# Patient Record
Sex: Female | Born: 1985 | Race: Black or African American | Hispanic: No | Marital: Single | Smoking: Never smoker
Health system: Southern US, Community
[De-identification: ages and names within clinical notes are randomized; demographics above are authoritative.]

## PROBLEM LIST (undated history)

## (undated) HISTORY — PX: FRACTURE SURGERY: SHX138

---
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
## 2006-04-14 ENCOUNTER — Emergency Department: Payer: Self-pay | Admitting: Unknown Physician Specialty

## 2006-07-08 ENCOUNTER — Emergency Department: Payer: Self-pay | Admitting: General Practice

## 2006-08-19 ENCOUNTER — Emergency Department: Payer: Self-pay | Admitting: Unknown Physician Specialty

## 2006-10-29 ENCOUNTER — Emergency Department: Payer: Self-pay | Admitting: General Practice

## 2006-11-02 ENCOUNTER — Emergency Department: Payer: Self-pay | Admitting: Emergency Medicine

## 2006-11-25 ENCOUNTER — Emergency Department: Payer: Self-pay | Admitting: Emergency Medicine

## 2007-04-29 ENCOUNTER — Emergency Department: Payer: Self-pay | Admitting: Emergency Medicine

## 2007-05-24 ENCOUNTER — Emergency Department: Payer: Self-pay | Admitting: Emergency Medicine

## 2007-07-23 ENCOUNTER — Emergency Department: Payer: Self-pay | Admitting: Emergency Medicine

## 2007-09-01 ENCOUNTER — Emergency Department: Payer: Self-pay | Admitting: Internal Medicine

## 2007-09-12 ENCOUNTER — Emergency Department: Payer: Self-pay | Admitting: Emergency Medicine

## 2007-10-26 ENCOUNTER — Emergency Department: Payer: Self-pay | Admitting: Emergency Medicine

## 2007-11-09 ENCOUNTER — Emergency Department: Payer: Self-pay | Admitting: Emergency Medicine

## 2008-01-05 ENCOUNTER — Emergency Department: Payer: Self-pay | Admitting: Emergency Medicine

## 2008-02-23 ENCOUNTER — Emergency Department: Payer: Self-pay | Admitting: Emergency Medicine

## 2008-03-24 ENCOUNTER — Emergency Department: Payer: Self-pay | Admitting: Internal Medicine

## 2008-04-07 ENCOUNTER — Emergency Department: Payer: Self-pay | Admitting: Internal Medicine

## 2008-07-24 ENCOUNTER — Emergency Department: Payer: Self-pay | Admitting: Emergency Medicine

## 2008-07-26 ENCOUNTER — Emergency Department: Payer: Self-pay | Admitting: Emergency Medicine

## 2008-08-19 ENCOUNTER — Emergency Department: Payer: Self-pay | Admitting: Emergency Medicine

## 2008-08-28 ENCOUNTER — Emergency Department: Payer: Self-pay | Admitting: Emergency Medicine

## 2008-09-11 ENCOUNTER — Emergency Department: Payer: Self-pay | Admitting: Emergency Medicine

## 2008-11-14 ENCOUNTER — Emergency Department: Payer: Self-pay | Admitting: Emergency Medicine

## 2008-11-29 ENCOUNTER — Emergency Department (HOSPITAL_COMMUNITY): Admission: EM | Admit: 2008-11-29 | Discharge: 2008-11-29 | Payer: Self-pay | Admitting: Emergency Medicine

## 2009-01-19 ENCOUNTER — Emergency Department: Payer: Self-pay | Admitting: Emergency Medicine

## 2009-02-01 ENCOUNTER — Emergency Department: Payer: Self-pay | Admitting: Emergency Medicine

## 2009-04-16 ENCOUNTER — Emergency Department: Payer: Self-pay | Admitting: Emergency Medicine

## 2009-06-02 ENCOUNTER — Emergency Department: Payer: Self-pay | Admitting: Emergency Medicine

## 2009-07-18 ENCOUNTER — Emergency Department: Payer: Self-pay | Admitting: Emergency Medicine

## 2009-08-04 ENCOUNTER — Emergency Department: Payer: Self-pay | Admitting: Emergency Medicine

## 2009-09-06 ENCOUNTER — Emergency Department: Payer: Self-pay | Admitting: Unknown Physician Specialty

## 2009-12-04 ENCOUNTER — Emergency Department: Payer: Self-pay | Admitting: Emergency Medicine

## 2010-01-15 ENCOUNTER — Emergency Department: Payer: Self-pay | Admitting: Emergency Medicine

## 2010-01-17 ENCOUNTER — Emergency Department: Payer: Self-pay | Admitting: Internal Medicine

## 2010-02-23 ENCOUNTER — Emergency Department: Payer: Self-pay | Admitting: Emergency Medicine

## 2010-02-28 ENCOUNTER — Emergency Department: Payer: Self-pay | Admitting: Emergency Medicine

## 2010-04-15 ENCOUNTER — Emergency Department: Payer: Self-pay | Admitting: Emergency Medicine

## 2010-05-02 IMAGING — CR RIGHT HAND - COMPLETE 3+ VIEW
1 series · 3 of 3 positions shown · non-contrast
Comparison: none

REASON FOR EXAM: fall/pain/swelling..pt in WR
COMMENTS:   LMP: One week ago

PROCEDURE:     DXR - DXR HAND RT COMPLETE W/OBLIQUES  - August 04, 2009  [DATE]
RESULT:     Comparison:  None

[Series 1: view not recorded · 0.17mm/px · 3 of 3 slices shown]
[im 1/3]
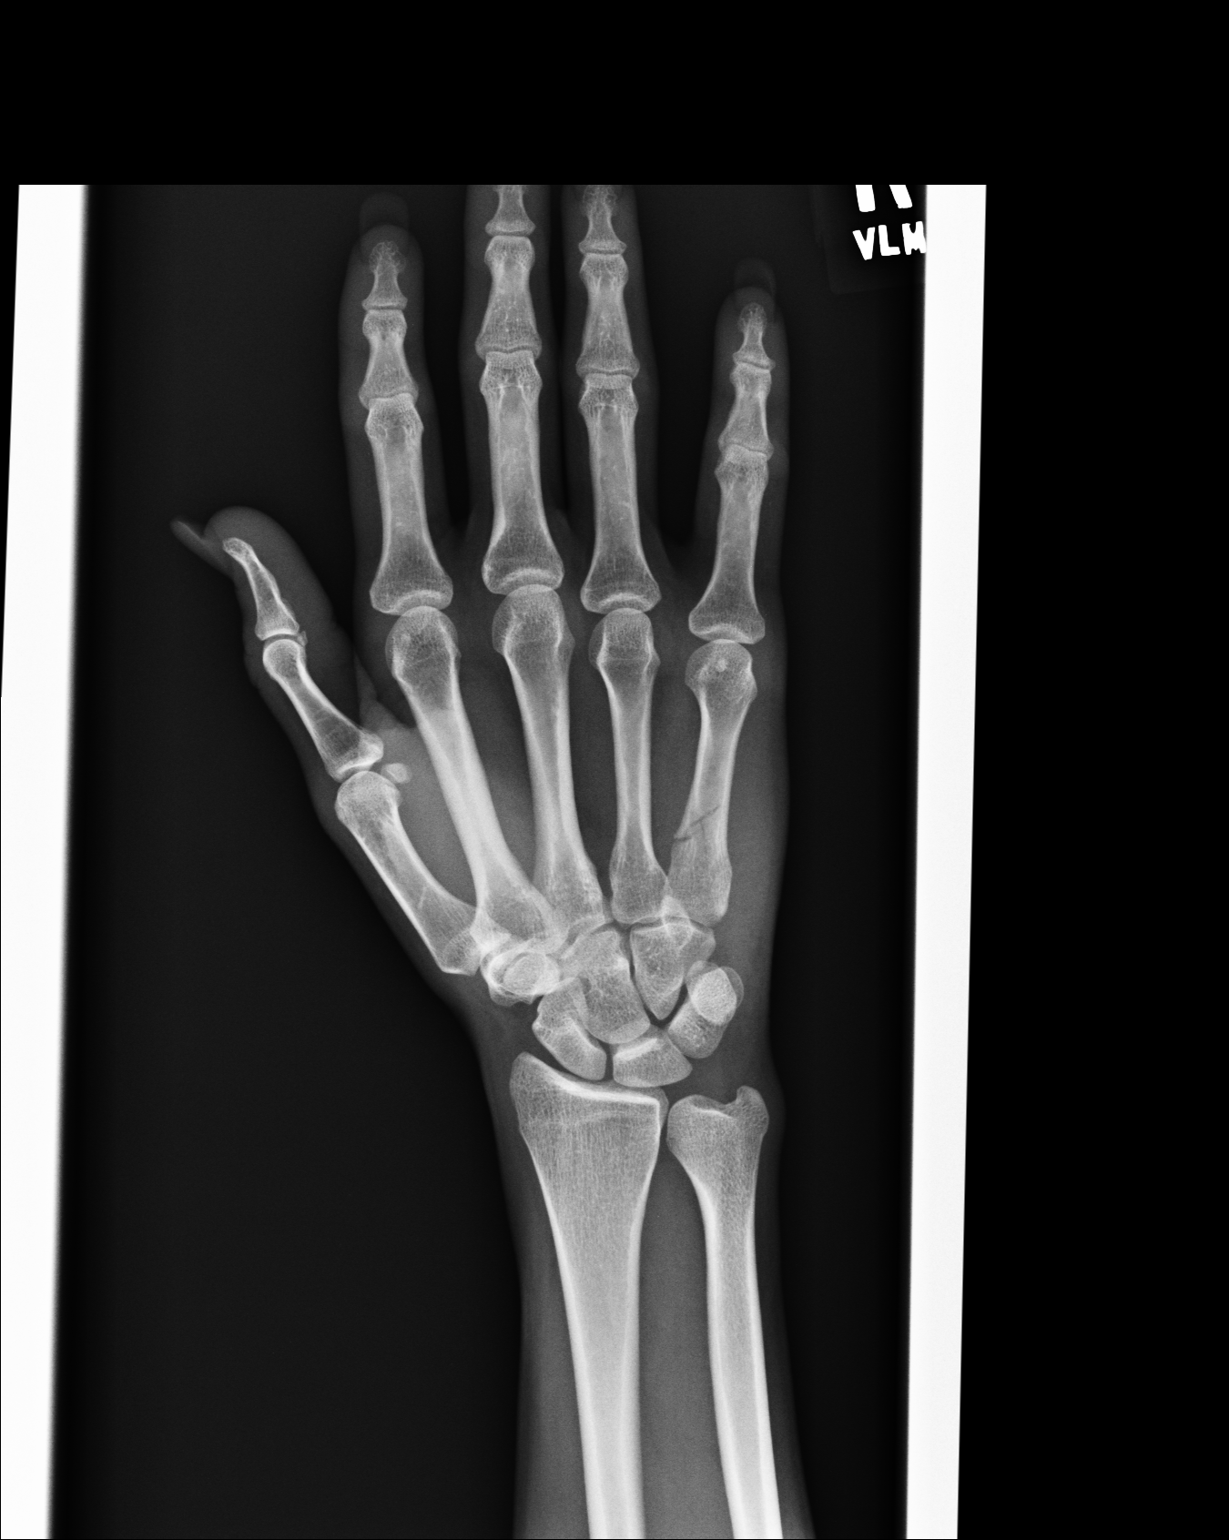
[im 2/3]
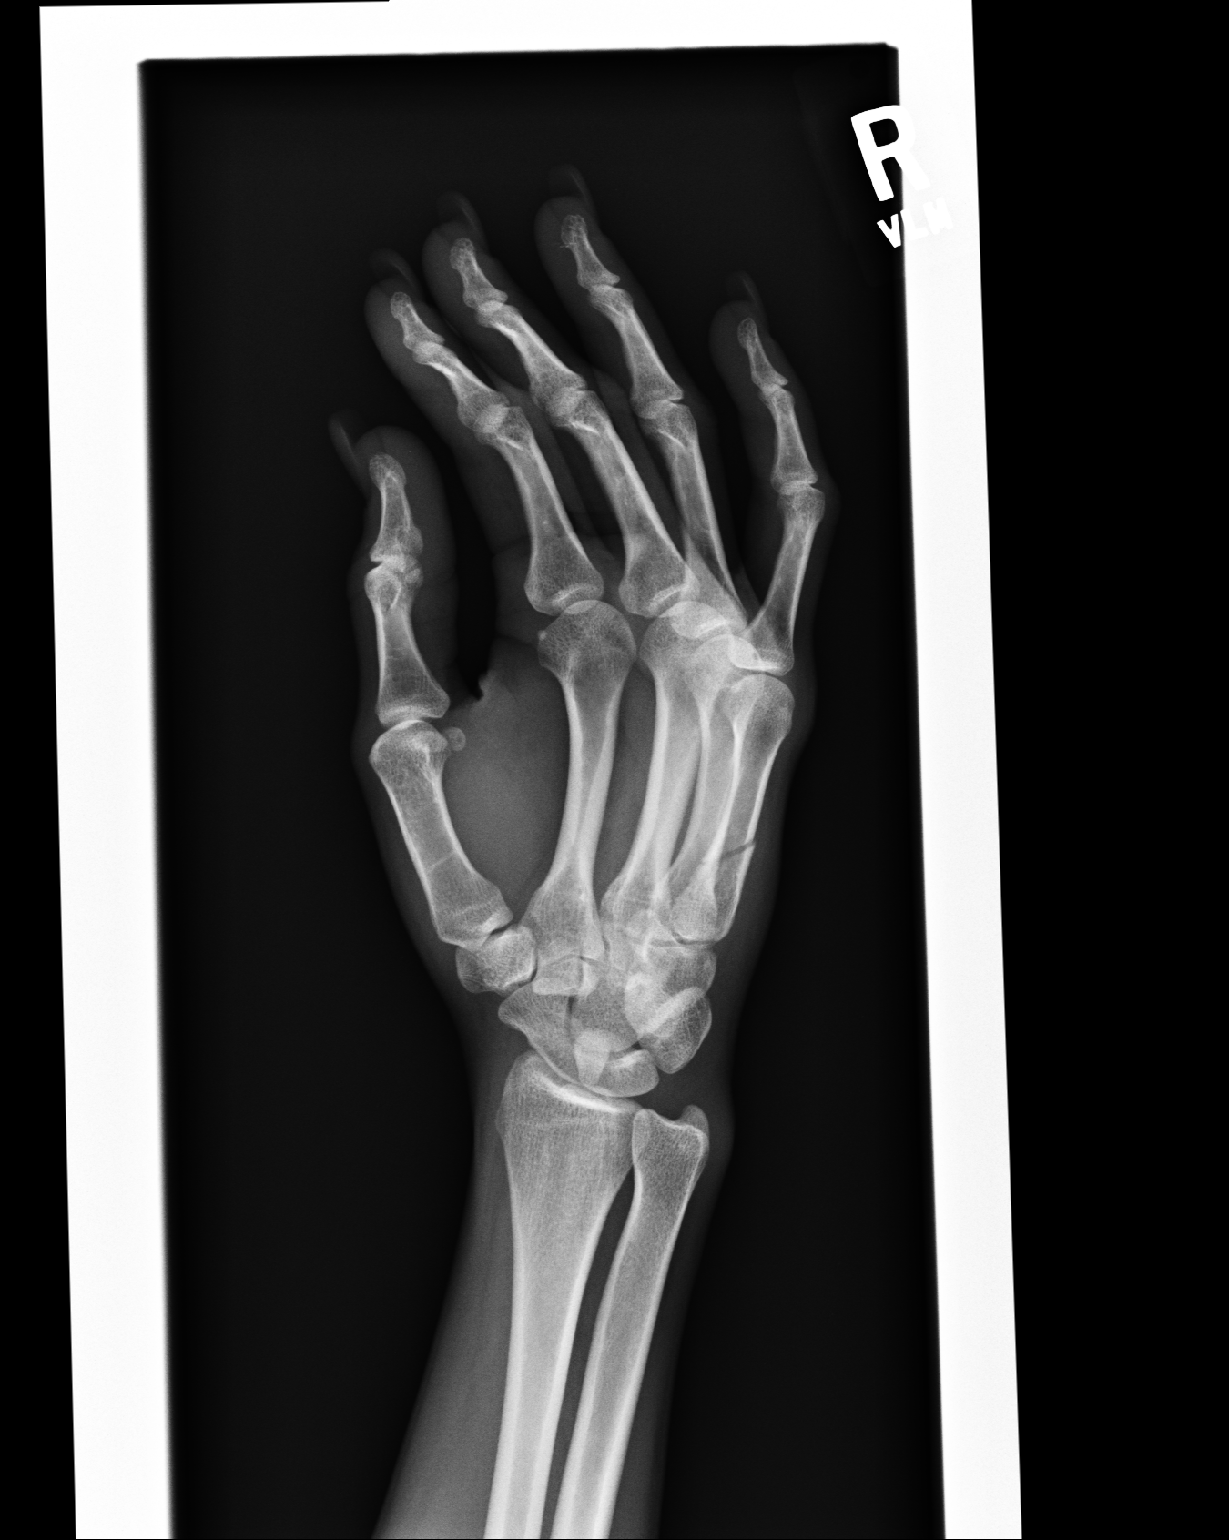
[im 3/3]
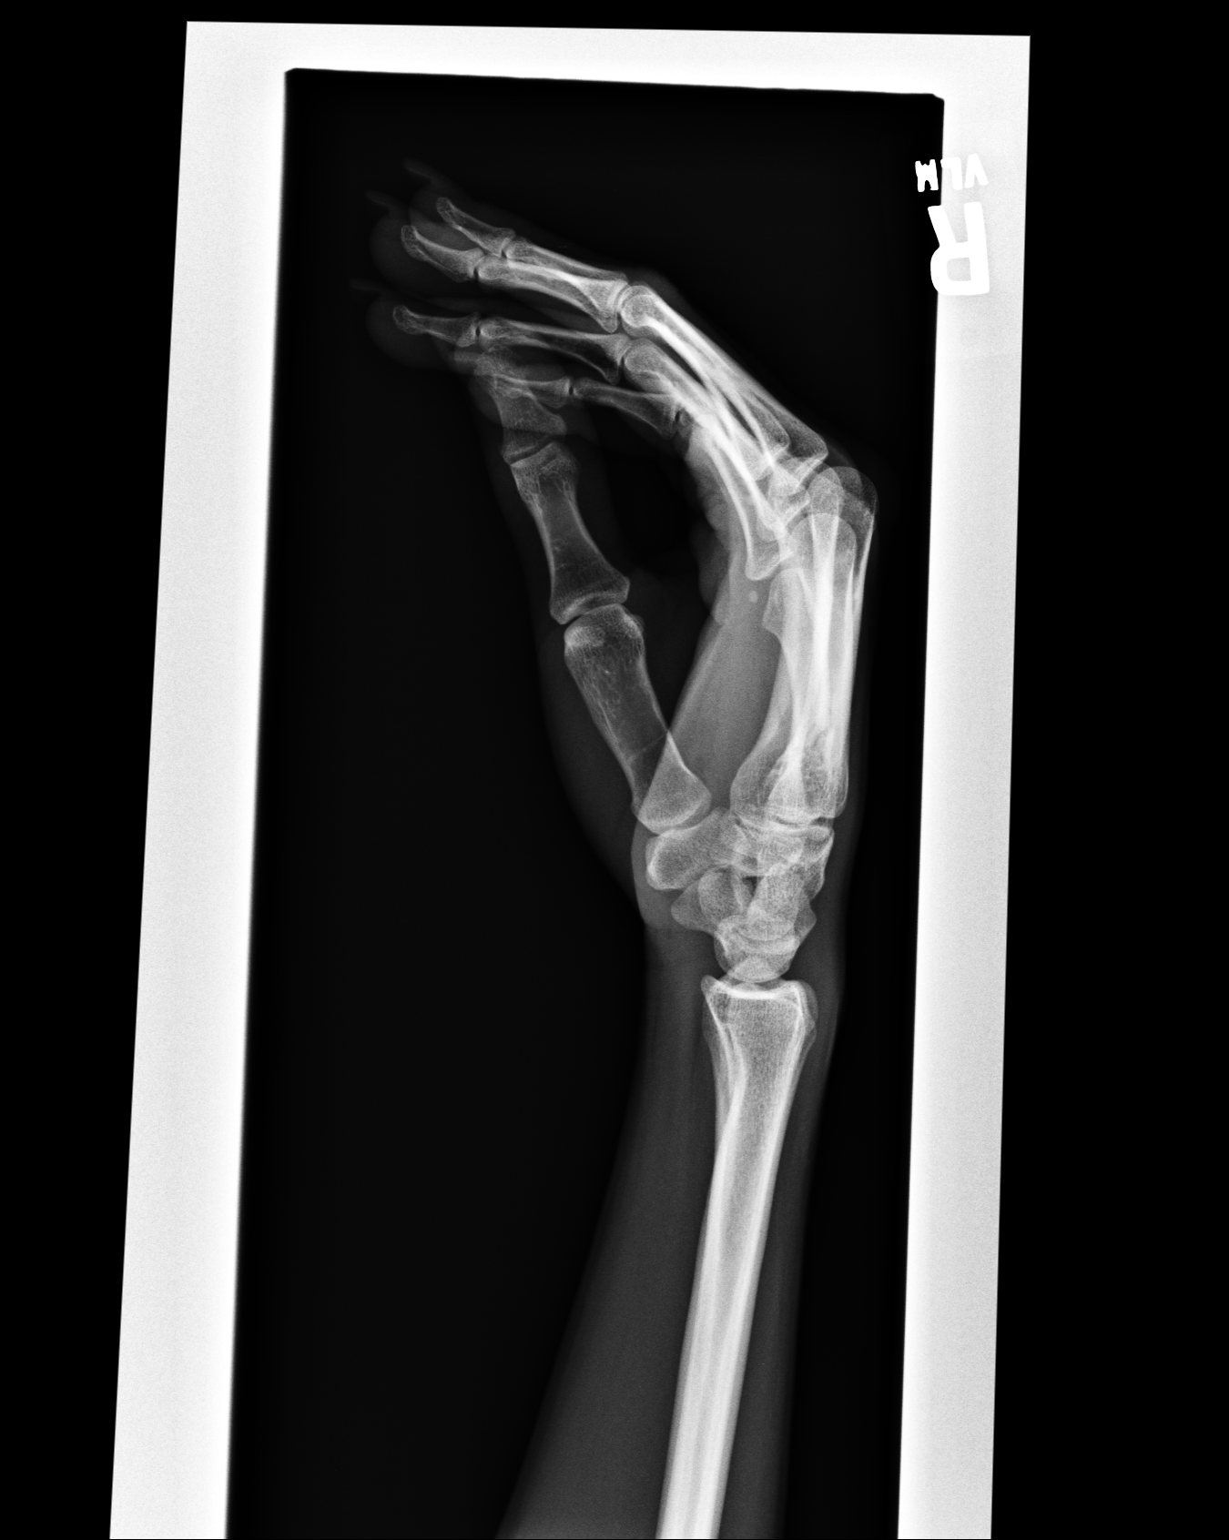

[3 of 3 positions shown; findings below may reference images not displayed]

FINDINGS: AP, oblique, and lateral views of the right hand demonstrates a mildly
comminuted fracture of the mid fifth metacarpal diaphysis without
significant angulation or displacement. There is no other fracture or
dislocation. There is no articular surface involvement. There is normal bone
mineralization. There are no erosive changes. The joint spaces are
maintained. There is no soft tissue swelling.
IMPRESSION: Mildly comminuted fracture of the right mid fifth metacarpal diaphysis
without significant angulation or displacement.

## 2010-08-05 ENCOUNTER — Emergency Department: Payer: Self-pay | Admitting: Emergency Medicine

## 2010-10-14 ENCOUNTER — Emergency Department: Payer: Self-pay | Admitting: Emergency Medicine

## 2011-01-19 ENCOUNTER — Emergency Department: Payer: Self-pay | Admitting: Emergency Medicine

## 2011-03-23 ENCOUNTER — Emergency Department: Payer: Self-pay | Admitting: Emergency Medicine

## 2011-03-30 ENCOUNTER — Emergency Department: Payer: Self-pay | Admitting: Emergency Medicine

## 2011-05-01 ENCOUNTER — Emergency Department: Payer: Self-pay | Admitting: Emergency Medicine

## 2011-09-30 ENCOUNTER — Emergency Department: Payer: Self-pay | Admitting: Emergency Medicine

## 2014-07-03 ENCOUNTER — Emergency Department: Payer: Self-pay | Admitting: Emergency Medicine

## 2014-07-03 LAB — BASIC METABOLIC PANEL
Anion Gap: 5 — ABNORMAL LOW (ref 7–16)
BUN: 12 mg/dL (ref 7–18)
CHLORIDE: 106 mmol/L (ref 98–107)
Calcium, Total: 8.9 mg/dL (ref 8.5–10.1)
Co2: 26 mmol/L (ref 21–32)
Creatinine: 0.86 mg/dL (ref 0.60–1.30)
GLUCOSE: 101 mg/dL — AB (ref 65–99)
Osmolality: 274 (ref 275–301)
POTASSIUM: 4 mmol/L (ref 3.5–5.1)
Sodium: 137 mmol/L (ref 136–145)

## 2014-07-03 LAB — CBC
HCT: 37.3 % (ref 35.0–47.0)
HGB: 12.3 g/dL (ref 12.0–16.0)
MCH: 30.8 pg (ref 26.0–34.0)
MCHC: 33 g/dL (ref 32.0–36.0)
MCV: 93 fL (ref 80–100)
PLATELETS: 253 10*3/uL (ref 150–440)
RBC: 4 10*6/uL (ref 3.80–5.20)
RDW: 13.5 % (ref 11.5–14.5)
WBC: 7.1 10*3/uL (ref 3.6–11.0)

## 2014-07-03 LAB — TROPONIN I: Troponin-I: <0.02 ng/mL

## 2014-07-12 ENCOUNTER — Emergency Department: Payer: Self-pay | Admitting: Emergency Medicine

## 2014-07-12 LAB — CBC WITH DIFFERENTIAL/PLATELET
Basophil #: 0.1 10*3/uL (ref 0.0–0.1)
Basophil %: 0.7 %
EOS ABS: 0.2 10*3/uL (ref 0.0–0.7)
EOS PCT: 2.2 %
HCT: 40.7 % (ref 35.0–47.0)
HGB: 13.3 g/dL (ref 12.0–16.0)
Lymphocyte #: 3.1 10*3/uL (ref 1.0–3.6)
Lymphocyte %: 43 %
MCH: 30.3 pg (ref 26.0–34.0)
MCHC: 32.7 g/dL (ref 32.0–36.0)
MCV: 93 fL (ref 80–100)
MONOS PCT: 7.7 %
Monocyte #: 0.6 x10 3/mm (ref 0.2–0.9)
Neutrophil #: 3.4 10*3/uL (ref 1.4–6.5)
Neutrophil %: 46.4 %
Platelet: 284 10*3/uL (ref 150–440)
RBC: 4.4 10*6/uL (ref 3.80–5.20)
RDW: 13.2 % (ref 11.5–14.5)
WBC: 7.3 10*3/uL (ref 3.6–11.0)

## 2014-07-12 LAB — COMPREHENSIVE METABOLIC PANEL
ALBUMIN: 3.9 g/dL (ref 3.4–5.0)
ALK PHOS: 99 U/L
AST: 20 U/L (ref 15–37)
Anion Gap: 8 (ref 7–16)
BUN: 12 mg/dL (ref 7–18)
Bilirubin,Total: 0.3 mg/dL (ref 0.2–1.0)
CALCIUM: 8.9 mg/dL (ref 8.5–10.1)
CHLORIDE: 106 mmol/L (ref 98–107)
Co2: 25 mmol/L (ref 21–32)
Creatinine: 0.94 mg/dL (ref 0.60–1.30)
GLUCOSE: 102 mg/dL — AB (ref 65–99)
Osmolality: 277 (ref 275–301)
Potassium: 3.1 mmol/L — ABNORMAL LOW (ref 3.5–5.1)
SGPT (ALT): 16 U/L
Sodium: 139 mmol/L (ref 136–145)
Total Protein: 8 g/dL (ref 6.4–8.2)

## 2014-07-12 LAB — D-DIMER(ARMC): D-DIMER: 932 ng/mL

## 2014-07-12 LAB — CK TOTAL AND CKMB (NOT AT ARMC)
CK, Total: 96 U/L
CK-MB: <0.5 ng/mL — ABNORMAL LOW (ref 0.5–3.6)

## 2014-07-12 LAB — TROPONIN I: Troponin-I: <0.02 ng/mL

## 2014-10-22 ENCOUNTER — Emergency Department: Payer: Self-pay | Admitting: Physician Assistant

## 2014-10-22 LAB — URINALYSIS, COMPLETE
Bacteria: NONE SEEN
Bilirubin,UR: NEGATIVE
Glucose,UR: NEGATIVE mg/dL (ref 0–75)
KETONE: NEGATIVE
NITRITE: NEGATIVE
PROTEIN: NEGATIVE
Ph: 6 (ref 4.5–8.0)
RBC,UR: 2 /HPF (ref 0–5)
Specific Gravity: 1.003 (ref 1.003–1.030)
Squamous Epithelial: 2
WBC UR: 63 /HPF (ref 0–5)

## 2014-10-22 LAB — WET PREP, GENITAL

## 2014-10-23 LAB — GC/CHLAMYDIA PROBE AMP

## 2015-03-31 IMAGING — CT CT ANGIO CHEST
2 of 6 series · 18 of 36 positions shown · IV contrast (APPLIED)
Comparison: Chest x-ray earlier today.

CLINICAL DATA: Chest pain, shortness of breath. History of recent
DVT.

EXAM:
CT ANGIOGRAPHY CHEST WITH CONTRAST
TECHNIQUE: Multidetector CT imaging of the chest was performed using the
standard protocol during bolus administration of intravenous
contrast. Multiplanar CT image reconstructions and MIPs were
obtained to evaluate the vascular anatomy.
CONTRAST:  75 cc Isovue 370 IV.

[Series 5: pe 1.0 thins · axial · 0.62mm/px · z∈[-748,-509]mm · 17 of 269 slices shown]
[im 15/269  lung]
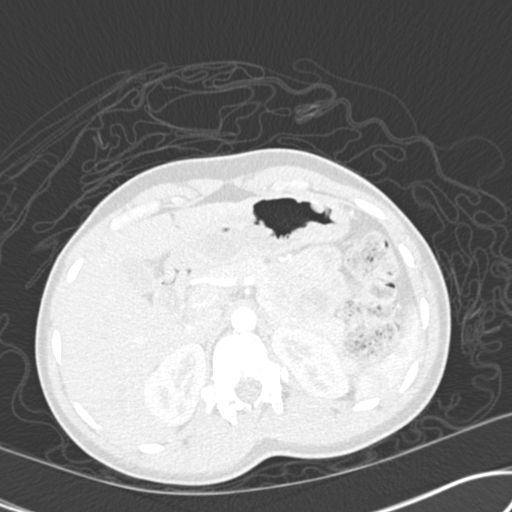
[im 30/269  mediastinal]
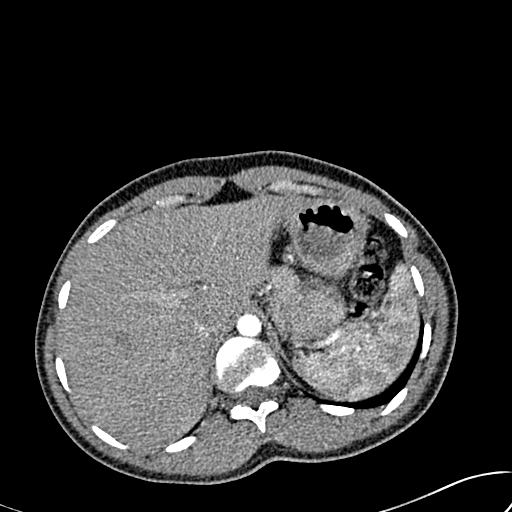
[im 45/269  lung]
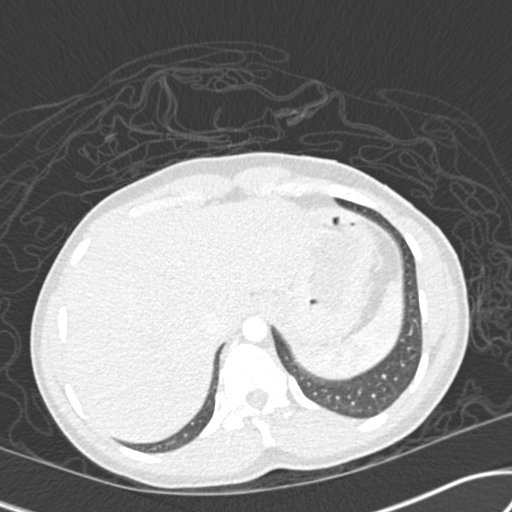
[im 60/269  mediastinal]
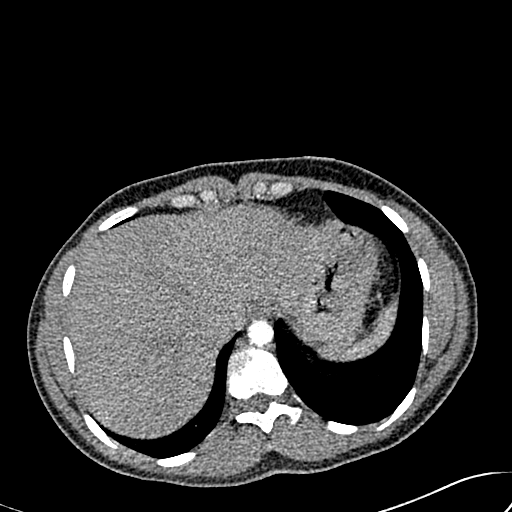
[im 75/269  lung]
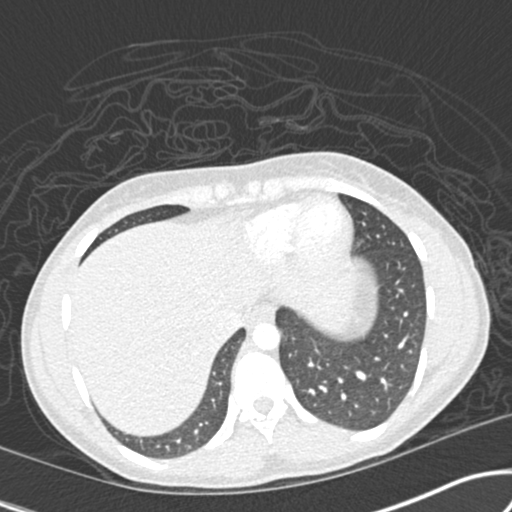
[im 90/269  mediastinal]
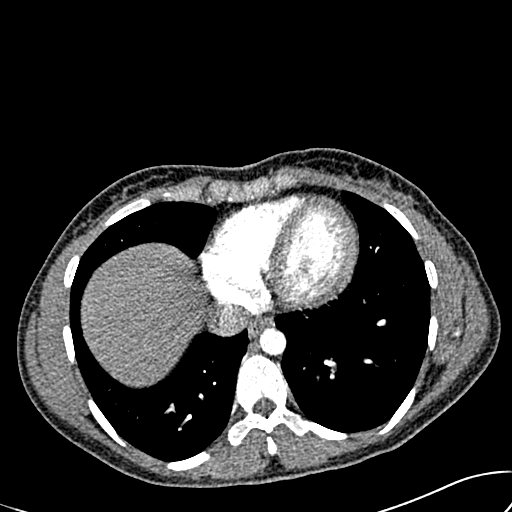
[im 105/269  lung]
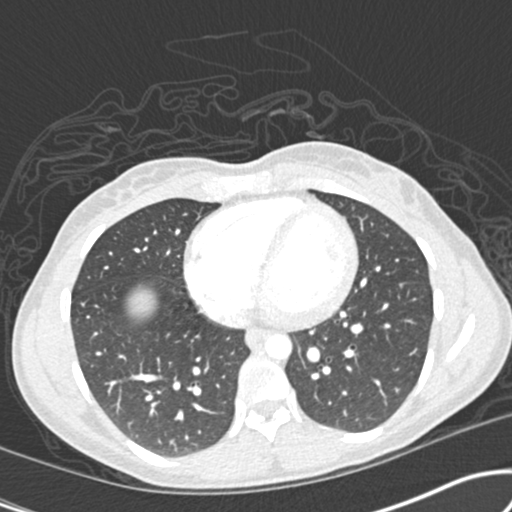
[im 120/269  mediastinal]
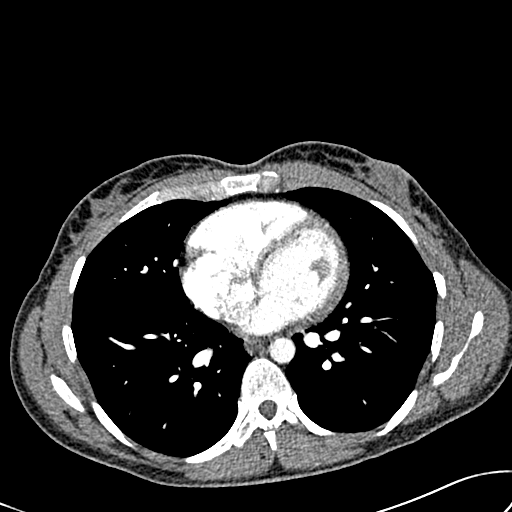
[im 135/269  lung]
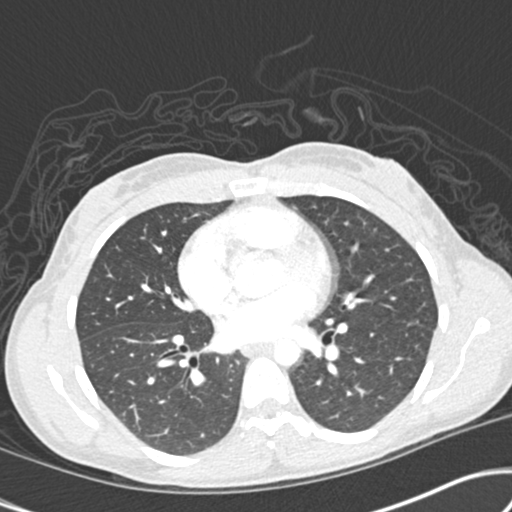
[im 149/269  mediastinal]
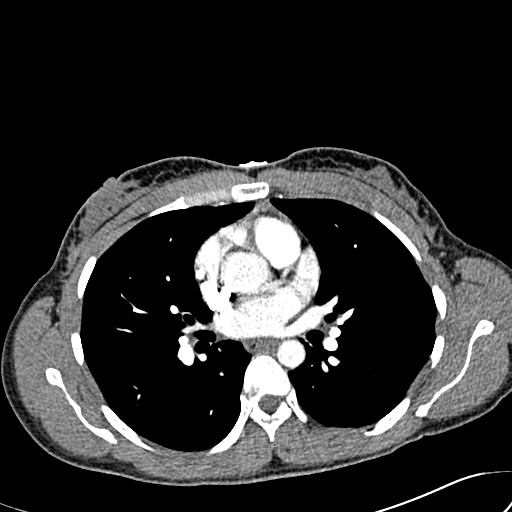
[im 164/269  lung]
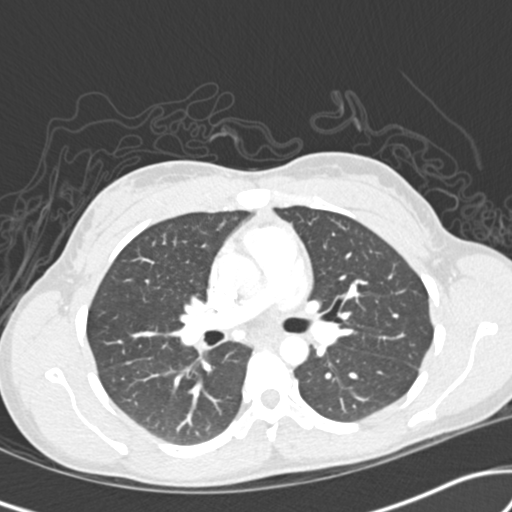
[im 179/269  mediastinal]
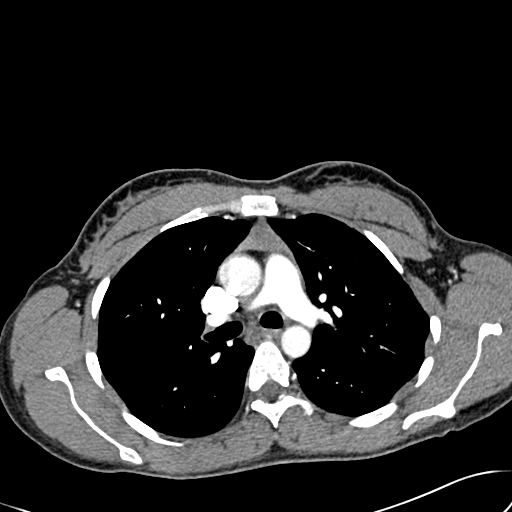
[im 194/269  lung]
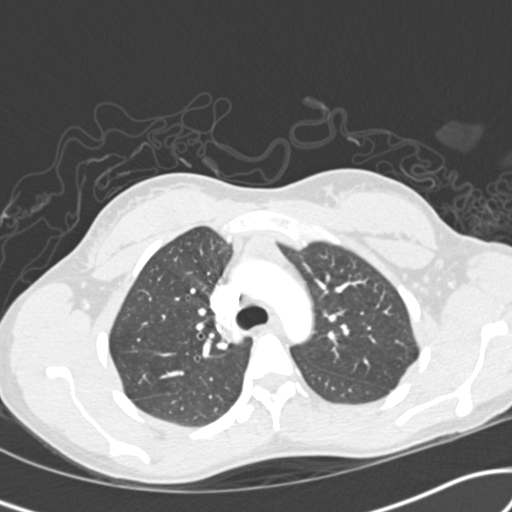
[im 209/269  mediastinal]
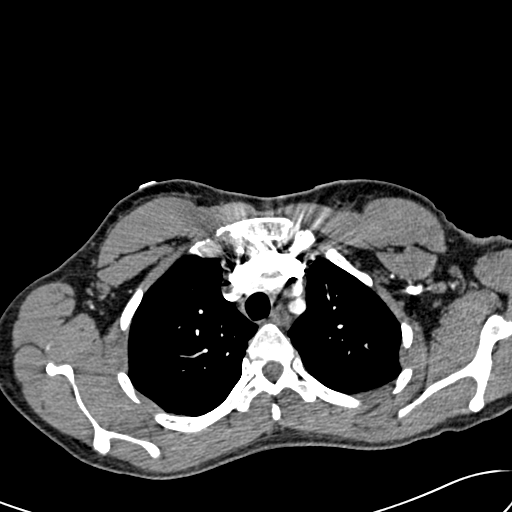
[im 224/269  lung]
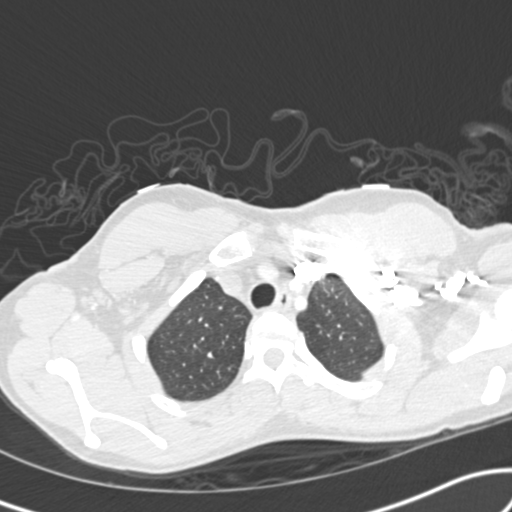
[im 239/269  mediastinal]
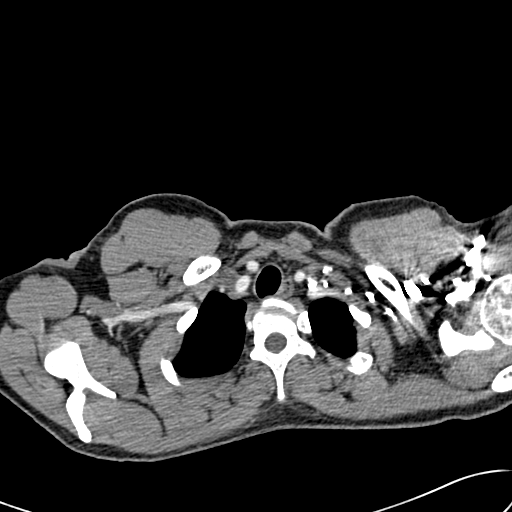
[im 254/269  lung]
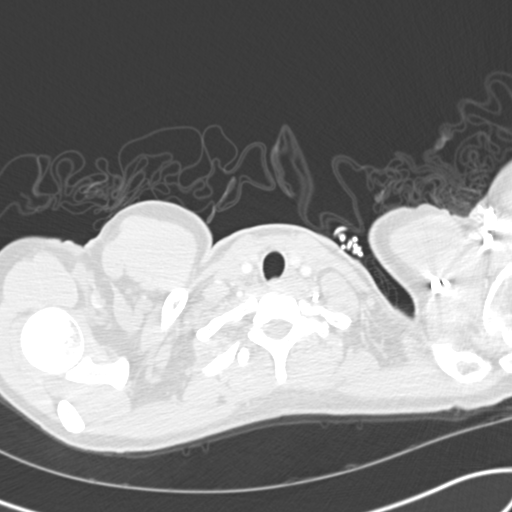

[Series 7: cor pe 2.0 mpr · coronal · 0.53mm/px · 1 of 110 slices shown]
[im 55/110  mediastinal]
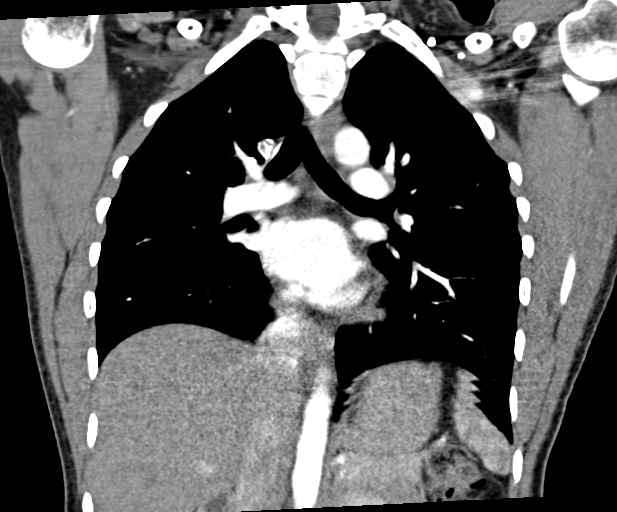

[18 of 36 positions shown; findings below may reference images not displayed]

FINDINGS: No filling defects in the pulmonary arteries to suggest pulmonary
emboli. Heart is normal size. Aorta is normal caliber. Chest wall
soft tissues are unremarkable. Lungs are clear. No focal airspace
opacities or suspicious nodules. No effusions. Imaging into the
upper abdomen shows no acute findings. No acute bony abnormality.

Review of the MIP images confirms the above findings.
IMPRESSION: No evidence of pulmonary embolus.  No acute cardiopulmonary disease.

## 2015-11-18 ENCOUNTER — Emergency Department
Admission: EM | Admit: 2015-11-18 | Discharge: 2015-11-18 | Disposition: A | Discharge status: Home / Self Care | Attending: Emergency Medicine | Admitting: Emergency Medicine

## 2015-11-18 DIAGNOSIS — R51 Headache: Secondary | ICD-10-CM | POA: Insufficient documentation

## 2015-11-18 DIAGNOSIS — R5383 Other fatigue: Secondary | ICD-10-CM | POA: Insufficient documentation

## 2015-11-18 DIAGNOSIS — R112 Nausea with vomiting, unspecified: Secondary | ICD-10-CM | POA: Diagnosis not present

## 2015-11-18 MED ORDER — ONDANSETRON 4 MG PO TBDP
4.0000 mg | ORAL_TABLET | Freq: Once | ORAL | Status: AC
  Administered 2015-11-18: 4 mg via ORAL

## 2015-11-18 MED ORDER — ONDANSETRON 4 MG PO TBDP: 4.0000 mg | ORAL_TABLET | Freq: Three times a day (TID) | ORAL | Status: AC | PRN

## 2015-11-18 MED ORDER — ONDANSETRON HCL 4 MG/2ML IJ SOLN: 4.0000 mg | Freq: Once | INTRAMUSCULAR | Status: DC | PRN

## 2015-11-18 MED ORDER — ONDANSETRON HCL 4 MG/2ML IJ SOLN
INTRAMUSCULAR | Status: AC
  Filled 2015-11-18: qty 2

## 2015-11-18 MED ORDER — ONDANSETRON 4 MG PO TBDP
ORAL_TABLET | ORAL | Status: AC
  Administered 2015-11-18: 4 mg via ORAL
  Filled 2015-11-18: qty 1

## 2015-11-18 NOTE — ED Notes (Signed)
Pt reports being out drinking last night and this morning with multiple episodes of vomiting.

## 2015-11-18 NOTE — ED Provider Notes (Addendum)
[facility] Emergency Department Provider Note  Time seen: 4:51 PM  I have reviewed the triage vital signs and the nursing notes.   HISTORY  Chief Complaint Emesis    HPI Candace Perez is a 30 y.o. female with no past medical history presents the emergency department nausea, vomiting, generalized fatigue. According to the patient she was out drinking until fairly late last night. States she does not recall getting home but awoke at one of her friend's houses. Upon awakening she states she still felt very lightheaded and vomited several times. Patient states over the day she continues to feel nauseated, and feels generalized fatigue. Also states mild headache. Denies diarrhea or abdominal pain. Denies fever. Denies recent cough or congestion.     No past medical history on file.  There are no active problems to display for this patient.   No past surgical history on file.  No current outpatient prescriptions on file.  Allergies Review of patient's allergies indicates no known allergies.  No family history on file.  Social History Social History  Substance Use Topics  . Smoking status: Not on file  . Smokeless tobacco: Not on file  . Alcohol Use: Not on file    Review of Systems Constitutional: Negative for fever. Positive for generalized fatigue. Cardiovascular: Negative for chest pain. Respiratory: Negative for shortness of breath. Gastrointestinal: Negative for abdominal pain. Positive for nausea or vomiting. Negative for diarrhea. Genitourinary: Negative for dysuria. Neurological: Mild headache. Denies any focal weakness or numbness. 10-point ROS otherwise negative.  ____________________________________________   PHYSICAL EXAM:  VITAL SIGNS: ED Triage Vitals  Enc Vitals Group     BP 11/18/15 1444 115/77 mmHg     Pulse Rate 11/18/15 1444 79     Resp 11/18/15 1444 18     Temp 11/18/15 1444 98.1 F (36.7 C)     Temp Source  11/18/15 1444 Oral     SpO2 11/18/15 1444 99 %     Weight 11/18/15 1444 130 lb (58.968 kg)     Height 11/18/15 1444  (1.753 m)     Head Cir --      Peak Flow --      Pain Score 11/18/15 1445 2     Pain Loc --      Pain Edu? --      Excl. in GC? --     Constitutional: Alert and oriented. Well appearing and in no distress. Eyes: Normal exam ENT   Head: Normocephalic and atraumatic.   Mouth/Throat: Mucous membranes are moist. Cardiovascular: Normal rate, regular rhythm. No murmur Respiratory: Normal respiratory effort without tachypnea nor retractions. Breath sounds are clear Gastrointestinal: Soft and nontender. No distention.   Musculoskeletal: Nontender with normal range of motion in all extremities.  Neurologic:  Normal speech and language. No gross focal neurologic deficits  Skin:  Skin is warm, dry and intact.  Psychiatric: Mood and affect are normal. Speech and behavior are normal.   ____________________________________________   INITIAL IMPRESSION / ASSESSMENT AND PLAN / ED COURSE  Pertinent labs & imaging results that were available during my care of the patient were reviewed by me and considered in my medical decision making (see chart for details).  Patient presents to the emergency department with nausea and vomiting and generalized weakness after drinking heavily per patient last night. Suspect likely alcohol intoxication last night with adverse effect of nausea, vomiting, dehydration today. Patient offered IV hydration labs however she states she would much prefer  not to be stuck with a needle. As the patient feels better, appears well and has normal vitals we will try oral Zofran with oral hydration in the emergency department. As long as the patient is able to tolerate fluids we will discharge home with Zofran. I discussed with the patient supportive care including Zofran every 8 hours as needed for nausea, drinking plenty of fluids and obtaining plenty of  rest. The patient is agreeable.  Patient is feeling better, able to tolerate oral fluids. We'll discharge home with Zofran.  ____________________________________________   FINAL CLINICAL IMPRESSION(S) / ED DIAGNOSES  Nausea and vomiting   Minna Antis, MD 11/18/15 1654  Minna Antis, MD 11/18/15 [phone]

## 2015-11-18 NOTE — Discharge Instructions (Signed)

## 2015-11-18 NOTE — ED Notes (Signed)
Pt given water for PO challenge 

## 2015-11-18 NOTE — ED Notes (Signed)
Pt able to drink with no vomiting 

## 2015-11-18 NOTE — ED Notes (Signed)
Pt alert and oriented X4, active, cooperative, pt in NAD. RR even and unlabored, color WNL.  Pt informed to return if any life threatening symptoms occur.   

## 2016-09-14 ENCOUNTER — Emergency Department
Admission: EM | Admit: 2016-09-14 | Discharge: 2016-09-14 | Disposition: A | Discharge status: Home / Self Care | Attending: Emergency Medicine | Admitting: Emergency Medicine

## 2016-09-14 DIAGNOSIS — N3001 Acute cystitis with hematuria: Secondary | ICD-10-CM | POA: Insufficient documentation

## 2016-09-14 DIAGNOSIS — R300 Dysuria: Secondary | ICD-10-CM | POA: Diagnosis present

## 2016-09-14 LAB — URINALYSIS, COMPLETE (UACMP) WITH MICROSCOPIC
Bilirubin Urine: NEGATIVE
GLUCOSE, UA: NEGATIVE mg/dL
Ketones, ur: 20 mg/dL — AB
Nitrite: NEGATIVE
PROTEIN: 30 mg/dL — AB
Specific Gravity, Urine: 1.025 (ref 1.005–1.030)
pH: 5 (ref 5.0–8.0)

## 2016-09-14 LAB — POCT PREGNANCY, URINE: Preg Test, Ur: NEGATIVE

## 2016-09-14 MED ORDER — PHENAZOPYRIDINE HCL 100 MG PO TABS: 100.0000 mg | ORAL_TABLET | Freq: Three times a day (TID) | ORAL | 0 refills | Status: AC | PRN

## 2016-09-14 MED ORDER — NITROFURANTOIN MONOHYD MACRO 100 MG PO CAPS: 100.0000 mg | ORAL_CAPSULE | Freq: Two times a day (BID) | ORAL | 0 refills | Status: AC

## 2016-09-14 NOTE — ED Triage Notes (Signed)
Pt c/o burning with urination for the past 2 days  With a hx of UTI. Denies discharge or odor.

## 2016-09-14 NOTE — ED Provider Notes (Signed)
[facility] Emergency Department Provider Note  ____________________________________________   First MD Initiated Contact with Patient 09/14/16 [phone]     (approximate)  I have reviewed the triage vital signs and the nursing notes.   HISTORY  Chief Complaint Urinary Tract Infection    HPI Candace Perez is a 30 y.o. female resenting with dysuria and increased urinary frequency for the past 2 days. Patient has a history of UTIs. Last diagnosis of UTI occurred 1 year ago. Patient is in a monogamous relationship and has no prior diagnoses of sexually transmitted diseases. Denies fever, suprapubic pain, flank pain, history of nephrolithiasis, nausea, vomiting and changes in vaginal discharge. Patient's last wellness exam with PCP occurred one month ago. No history of diabetes. She has been in the army for the past 5 years.   History reviewed. No pertinent past medical history.  There are no active problems to display for this patient.   Past Surgical History:  Procedure Laterality Date  . FRACTURE SURGERY     right leg    Prior to Admission medications   Medication Sig Start Date End Date Taking? Authorizing Provider  nitrofurantoin, macrocrystal-monohydrate, (MACROBID) 100 MG capsule Take 1 capsule (100 mg total) by mouth 2 (two) times daily. 09/14/16 09/21/16  Orvil FeilJaclyn M Mees, PA-C  ondansetron (ZOFRAN ODT) 4 MG disintegrating tablet Take 1 tablet (4 mg total) by mouth every 8 (eight) hours as needed for nausea or vomiting. 11/18/15   Minna AntisKevin Paduchowski, MD  phenazopyridine (PYRIDIUM) 100 MG tablet Take 1 tablet (100 mg total) by mouth 3 (three) times daily as needed for pain. 09/14/16 09/16/16  Orvil FeilJaclyn M Jenne, PA-C    Allergies Patient has no known allergies.  No family history on file.  Social History Social History  Substance Use Topics  . Smoking status: Never Smoker  . Smokeless tobacco: Never Used  . Alcohol use No    Review of  Systems Constitutional: No fever/chills Eyes: No visual changes. ENT: No sore throat. Cardiovascular: Denies chest pain. Respiratory: Denies shortness of breath. Gastrointestinal: No abdominal pain.  No nausea, no vomiting.  No diarrhea.  Has suprapubic discomfort. Genitourinary: Has dysuria and increased frequency. Musculoskeletal: Negative for back pain. Skin: Negative for rash. Neurological: Negative for headaches, focal weakness or numbness.  10-point ROS otherwise negative.  ____________________________________________   PHYSICAL EXAM:  VITAL SIGNS: ED Triage Vitals [09/14/16 0825]  Enc Vitals Group     BP (!) 112/94     Pulse Rate 80     Resp 16     Temp 97.4 F (36.3 C)     Temp Source Oral     SpO2 98 %     Weight 140 lb (63.5 kg)     Height 5\' 9"  (1.753 m)     Head Circumference      Peak Flow      Pain Score 8     Pain Loc      Pain Edu?      Excl. in GC?    Constitutional: Alert and oriented. Well appearing and in no acute distress. Eyes: Conjunctivae are normal. PERRL. EOMI. Head: Atraumatic. Cardiovascular: Normal rate, regular rhythm. Grossly normal heart sounds.  Good peripheral circulation. Respiratory: Normal respiratory effort.  No retractions. Lungs CTAB. Gastrointestinal: Has suprapubic tenderness to light palpation. No distention. No abdominal bruits. No CVA tenderness. Skin:  Skin is warm, dry and intact. No rash noted. Psychiatric: Mood and affect are normal. Speech and behavior are normal.  ____________________________________________   LABS (all labs ordered are listed, but only abnormal results are displayed)  Labs Reviewed  URINALYSIS, COMPLETE (UACMP) WITH MICROSCOPIC - Abnormal; Notable for the following:       Result Value   Color, Urine YELLOW (*)    APPearance CLOUDY (*)    Hgb urine dipstick SMALL (*)    Ketones, ur 20 (*)    Protein, ur 30 (*)    Leukocytes, UA MODERATE (*)    Bacteria, UA RARE (*)    Squamous Epithelial  / LPF 6-30 (*)    All other components within normal limits  POC URINE PREG, ED  POCT PREGNANCY, URINE    Procedures: None     ____________________________________________   INITIAL IMPRESSION / ASSESSMENT AND PLAN / ED COURSE  Pertinent labs & imaging results that were available during my care of the patient were reviewed by me and considered in my medical decision making (see chart for details).    Clinical Course    Assessment and plan: Uncomplicated cystitis.  Patient has dysuria and increased urinary frequency for the past 2 days coupled with leukocytes and blood on urinalysis. I have a low index of suspicion for pyelonephritis or nephrolithiasis. Patient was treated with Macrobid and Pyridium. Vital signs are reassuring. All patient questions were answered. Strict return precautions were given.  ____________________________________________   FINAL CLINICAL IMPRESSION(S) / ED DIAGNOSES  Final diagnoses:  Acute cystitis with hematuria      NEW MEDICATIONS STARTED DURING THIS VISIT:  Discharge Medication List as of 09/14/2016  9:38 AM    START taking these medications   Details  nitrofurantoin, macrocrystal-monohydrate, (MACROBID) 100 MG capsule Take 1 capsule (100 mg total) by mouth 2 (two) times daily., Starting Sun 09/14/2016, Until Sun 09/21/2016, Print    phenazopyridine (PYRIDIUM) 100 MG tablet Take 1 tablet (100 mg total) by mouth 3 (three) times daily as needed for pain., Starting Sun 09/14/2016, Until Tue 09/16/2016, Print         Note:  This document was prepared using Dragon voice recognition software and may include unintentional dictation errors.    Orvil FeilJaclyn M Condron, PA-C 09/14/16 1301    Sharyn CreamerMark Quale, MD 09/14/16 [phone]

## 2016-10-09 ENCOUNTER — Ambulatory Visit
Admission: EM | Admit: 2016-10-09 | Discharge: 2016-10-09 | Disposition: A | Discharge status: Home / Self Care | Attending: Family Medicine | Admitting: Family Medicine

## 2016-10-09 ENCOUNTER — Encounter: Payer: Self-pay | Admitting: *Deleted

## 2016-10-09 DIAGNOSIS — B3731 Acute candidiasis of vulva and vagina: Secondary | ICD-10-CM

## 2016-10-09 DIAGNOSIS — B373 Candidiasis of vulva and vagina: Secondary | ICD-10-CM

## 2016-10-09 DIAGNOSIS — R319 Hematuria, unspecified: Secondary | ICD-10-CM | POA: Diagnosis not present

## 2016-10-09 DIAGNOSIS — N390 Urinary tract infection, site not specified: Secondary | ICD-10-CM

## 2016-10-09 DIAGNOSIS — B9689 Other specified bacterial agents as the cause of diseases classified elsewhere: Secondary | ICD-10-CM | POA: Diagnosis not present

## 2016-10-09 DIAGNOSIS — N760 Acute vaginitis: Secondary | ICD-10-CM

## 2016-10-09 LAB — WET PREP, GENITAL
SPERM: NONE SEEN
TRICH WET PREP: NEGATIVE — AB

## 2016-10-09 LAB — URINALYSIS, COMPLETE (UACMP) WITH MICROSCOPIC
Bilirubin Urine: NEGATIVE
GLUCOSE, UA: NEGATIVE mg/dL
HGB URINE DIPSTICK: NEGATIVE
Ketones, ur: NEGATIVE mg/dL
Nitrite: NEGATIVE
PH: 7 (ref 5.0–8.0)
Protein, ur: NEGATIVE mg/dL
SPECIFIC GRAVITY, URINE: 1.02 (ref 1.005–1.030)

## 2016-10-09 LAB — CHLAMYDIA/NGC RT PCR (ARMC ONLY)
Chlamydia Tr: NOT DETECTED
N gonorrhoeae: NOT DETECTED

## 2016-10-09 LAB — PREGNANCY, URINE: Preg Test, Ur: NEGATIVE

## 2016-10-09 MED ORDER — TERCONAZOLE 80 MG VA SUPP: 80.0000 mg | Freq: Every day | VAGINAL | 0 refills | Status: AC

## 2016-10-09 MED ORDER — METRONIDAZOLE 500 MG PO TABS: 500.0000 mg | ORAL_TABLET | Freq: Two times a day (BID) | ORAL | 0 refills | Status: AC

## 2016-10-09 MED ORDER — FLUCONAZOLE 150 MG PO TABS: 150.0000 mg | ORAL_TABLET | Freq: Once | ORAL | 0 refills | Status: AC

## 2016-10-09 MED ORDER — PHENAZOPYRIDINE HCL 200 MG PO TABS: 200.0000 mg | ORAL_TABLET | Freq: Three times a day (TID) | ORAL | 0 refills | Status: AC | PRN

## 2016-10-09 MED ORDER — CIPROFLOXACIN HCL 500 MG PO TABS: 500.0000 mg | ORAL_TABLET | Freq: Two times a day (BID) | ORAL | 0 refills | Status: AC

## 2016-10-09 NOTE — ED Provider Notes (Signed)
[facility]    CSN: [id number] Arrival date & time: 10/09/16  1000     History   Chief Complaint Chief Complaint  Patient presents with  . Hematuria    HPI Candace Perez is a 31 y.o. female.   Patient is a 31 year old African American female who presents to going to the emergency room about 2 weeks ago for UTI. Initially she said was Levaquin they put her on Rosalie Macrobid. Anyway she states that despite being treated with the antibiotic 2 weeks ago she started having burning on urination irritation of the vaginal area and increased vaginal dryness. She also reports when she went to the bathroom today she saw blood in your urine since became concerned and came in to be seen and evaluated She reports dyspareunia but felt that was secondary to vaginal dryness. She does report having a discharge. She is gravida 1 para 1 AB O0. She unfortunately she is not taking any regular birth control and partner uses condoms most of the time. Habits she does not smoke does not chew tobacco she's had fracture surgery on her right leg she has plates and screws in the leg. 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 No significant pertinent family medical history relevant to today's visit.   The history is provided by the patient. No language interpreter was used.  Hematuria  This is a new problem. The problem has been gradually worsening. Pertinent negatives include no chest pain, no headaches and no shortness of breath. Nothing aggravates the symptoms. Nothing relieves the symptoms. The treatment provided mild relief.    History reviewed. No pertinent past medical history.  There are no active problems to display for this patient.   Past Surgical History:  Procedure Laterality Date  . FRACTURE SURGERY     right leg    OB History    No data available        Home Medications    Prior to Admission medications   Medication Sig Start Date End Date Taking? Authorizing Provider  ciprofloxacin (CIPRO) 500 MG tablet Take 1 tablet (500 mg total) by mouth 2 (two) times daily. 10/09/16   Hassan RowanEugene Guinevere Stephenson, MD  fluconazole (DIFLUCAN) 150 MG tablet Take 1 tablet (150 mg total) by mouth once. Take after the Cipro and Flagyl have been used if there is any further irritation of the vaginal area 10/09/16 10/09/16  Hassan RowanEugene Shaine Mount, MD  metroNIDAZOLE (FLAGYL) 500 MG tablet Take 1 tablet (500 mg total) by mouth 2 (two) times daily. 10/09/16   Hassan RowanEugene Amro Winebarger, MD  ondansetron (ZOFRAN ODT) 4 MG disintegrating tablet Take 1 tablet (4 mg total) by mouth every 8 (eight) hours as needed for nausea or vomiting. 11/18/15   Minna AntisKevin Paduchowski, MD  phenazopyridine (PYRIDIUM) 200 MG tablet Take 1 tablet (200 mg total) by mouth 3 (three) times daily as needed for pain. 10/09/16   Hassan RowanEugene Kodiak Rollyson, MD  terconazole (TERAZOL 3) 80 MG vaginal suppository Place 1 suppository (80 mg total) vaginally at bedtime. 10/09/16   Hassan RowanEugene Jessabelle Markiewicz, MD    Family History History reviewed. No pertinent family history.  Social History Social History  Substance Use Topics  . Smoking status: Never Smoker  . Smokeless tobacco: Never Used  . Alcohol use No     Allergies   Patient has no known allergies.   Review of Systems Review of  Systems  Respiratory: Negative for shortness of breath.   Cardiovascular: Negative for chest pain.  Genitourinary: Positive for dyspareunia, dysuria, hematuria, vaginal discharge and vaginal pain. Negative for enuresis, flank pain and frequency.  Neurological: Negative for headaches.  All other systems reviewed and are negative.    Physical Exam Triage Vital Signs ED Triage Vitals  Enc Vitals Group     BP 10/09/16 1019 106/67     Pulse Rate 10/09/16 1019 77     Resp 10/09/16 1019 16     Temp 10/09/16 1019 98.2 F (36.8 C)     Temp Source 10/09/16 1019 Oral     SpO2 10/09/16  1019 99 %     Weight 10/09/16 1019 135 lb (61.2 kg)     Height 10/09/16 1019 5\' 9"  (1.753 m)     Head Circumference --      Peak Flow --      Pain Score 10/09/16 1025 5     Pain Loc --      Pain Edu? --      Excl. in GC? --    No data found.   Updated Vital Signs BP 106/67 (BP Location: Left Arm)   Pulse 77   Temp 98.2 F (36.8 C) (Oral)   Resp 16   Ht 5\' 9"  (1.753 m)   Wt 135 lb (61.2 kg)   LMP 10/06/2016   SpO2 99%   BMI 19.94 kg/m   Visual Acuity Right Eye Distance:   Left Eye Distance:   Bilateral Distance:    Right Eye Near:   Left Eye Near:    Bilateral Near:     Physical Exam  Constitutional: She is oriented to person, place, and time. She appears well-developed and well-nourished. No distress.  HENT:  Head: Normocephalic and atraumatic.  Right Ear: External ear normal.  Left Ear: External ear normal.  Eyes: Conjunctivae and EOM are normal. Pupils are equal, round, and reactive to light.  Neck: Normal range of motion. Neck supple. No tracheal deviation present. No thyromegaly present.  Pulmonary/Chest: Effort normal.  Abdominal: Soft. Bowel sounds are normal. Hernia confirmed negative in the right inguinal area.  Genitourinary: Rectum normal and uterus normal. There is no rash or tenderness on the right labia. There is no rash or tenderness on the left labia. Uterus is not deviated, not enlarged, not fixed and not tender. Cervix exhibits no motion tenderness and no friability. Right adnexum displays no mass and no tenderness. Left adnexum displays no mass. There is tenderness in the vagina. Vaginal discharge found.  Musculoskeletal: Normal range of motion.  Neurological: She is alert and oriented to person, place, and time.  Skin: Skin is warm. She is not diaphoretic.  Psychiatric: She has a normal mood and affect.  Vitals reviewed.    UC Treatments / Results  Labs (all labs ordered are listed, but only abnormal results are displayed) Labs Reviewed   WET PREP, GENITAL - Abnormal; Notable for the following:       Result Value   Yeast Wet Prep HPF POC PRESENT (*)    Trich, Wet Prep NEGATIVE (*)    Clue Cells Wet Prep HPF POC PRESENT (*)    WBC, Wet Prep HPF POC FEW (*)    All other components within normal limits  URINALYSIS, COMPLETE (UACMP) WITH MICROSCOPIC - Abnormal; Notable for the following:    Leukocytes, UA MODERATE (*)    Squamous Epithelial / LPF 6-30 (*)    Bacteria, UA FEW (*)  All other components within normal limits  URINE CULTURE  CHLAMYDIA/NGC RT PCR ([facility] ONLY)  PREGNANCY, URINE    EKG  EKG Interpretation None       Radiology No results found.  Procedures Procedures (including critical care time)  Medications Ordered in UC Medications - No data to display   Initial Impression / Assessment and Plan / UC Course  I have reviewed the triage vital signs and the nursing notes.  Pertinent labs & imaging results that were available during my care of the patient were reviewed by me and considered in my medical decision making (see chart for details).  Clinical Course    Patient declined RPR and HIV testing. However she has bacterial vaginosis present and yeast and blood in the urine. Will treat for hemorrhagic cystitis with Cipro 500 mg for week from Dr. vaginosis Flagyl for 10 days and Terazol vaginal suppositories for the yeast infection. Also give her Diflucan tablet to use after she gets through with the Flagyl if she has recurrent trouble with the [place] follow-up in 2-3 weeks with PCP as needed after verification of hematuria clearance  Results for orders placed or performed during the hospital encounter of 10/09/16  Wet prep, genital  Result Value Ref Range   Yeast Wet Prep HPF POC PRESENT (A) NONE SEEN   Trich, Wet Prep NEGATIVE (A) NONE SEEN   Clue Cells Wet Prep HPF POC PRESENT (A) NONE SEEN   WBC, Wet Prep HPF POC FEW (A) NONE SEEN   Sperm NONE SEEN   Urinalysis, Complete w  Microscopic  Result Value Ref Range   Color, Urine YELLOW YELLOW   APPearance CLEAR CLEAR   Specific Gravity, Urine 1.020 1.005 - 1.030   pH 7.0 5.0 - 8.0   Glucose, UA NEGATIVE NEGATIVE mg/dL   Hgb urine dipstick NEGATIVE NEGATIVE   Bilirubin Urine NEGATIVE NEGATIVE   Ketones, ur NEGATIVE NEGATIVE mg/dL   Protein, ur NEGATIVE NEGATIVE mg/dL   Nitrite NEGATIVE NEGATIVE   Leukocytes, UA MODERATE (A) NEGATIVE   Squamous Epithelial / LPF 6-30 (A) NONE SEEN   WBC, UA 6-30 0 - 5 WBC/hpf   RBC / HPF 0-5 0 - 5 RBC/hpf   Bacteria, UA FEW (A) NONE SEEN  Pregnancy, urine  Result Value Ref Range   Preg Test, Ur NEGATIVE NEGATIVE   Final Clinical Impressions(s) / UC Diagnoses   Final diagnoses:  Urinary tract infection with hematuria, site unspecified  Yeast infection involving the vagina and surrounding area  Bacterial vaginosis     New Prescriptions New Prescriptions   CIPROFLOXACIN (CIPRO) 500 MG TABLET    Take 1 tablet (500 mg total) by mouth 2 (two) times daily.   FLUCONAZOLE (DIFLUCAN) 150 MG TABLET    Take 1 tablet (150 mg total) by mouth once. Take after the Cipro and Flagyl have been used if there is any further irritation of the vaginal area   METRONIDAZOLE (FLAGYL) 500 MG TABLET    Take 1 tablet (500 mg total) by mouth 2 (two) times daily.   PHENAZOPYRIDINE (PYRIDIUM) 200 MG TABLET    Take 1 tablet (200 mg total) by mouth 3 (three) times daily as needed for pain.   TERCONAZOLE (TERAZOL 3) 80 MG VAGINAL SUPPOSITORY    Place 1 suppository (80 mg total) vaginally at bedtime.     Note: This dictation was prepared with Dragon dictation along with smaller phrase technology. Any transcriptional errors that result from this process are unintentional.   Hassan Rowan,  MD 10/09/16 1139

## 2016-10-09 NOTE — ED Triage Notes (Signed)
Patient started having blood in urine 2 days ago. Patient was treated for UTI 2 weeks ago. Symptoms improved but never resolved.

## 2016-10-10 LAB — URINE CULTURE: Special Requests: NORMAL

## 2016-10-12 ENCOUNTER — Telehealth: Payer: Self-pay | Admitting: *Deleted

## 2016-10-12 NOTE — Telephone Encounter (Signed)
Called patient, verified DOB, communicated negative chlamydia and gonorrhea test results. Patient confirmed understanding of test results. 

## 2017-08-18 ENCOUNTER — Emergency Department: Admission: EM | Admit: 2017-08-18 | Discharge: 2017-08-18 | Disposition: A | Discharge status: Home / Self Care

## 2017-08-18 NOTE — ED Notes (Signed)
RN entered triage room and pt was talking to significant other. Pt reported she did not feel as anxious any longer and felt as though she could go home. RN asked pt if she felt safe at home and if she had thoughts of hurting herself. Pt denied having SI or HI and denied drug use.   Significant other reported pt had already taken anxiety medication before EMS arrived but they did not work as quick as they normally did and they felt as though she was not getting better. Pt able to talk in complete sentences and significant other reports this is usually a good sign that the panic attack has passed. RN spoke to pt and fells as though pt is in NAD at this time. Pt able to  ambulate and answer all questions correctly. RR under control and pt no longer hyperventilating.
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
## 2018-04-05 DEATH — deceased
